# Patient Record
Sex: Female | Born: 1950 | Race: Black or African American | Hispanic: No | Smoking: Former smoker
Health system: Southern US, Community
[De-identification: ages and names within clinical notes are randomized; demographics above are authoritative.]

## PROBLEM LIST (undated history)

## (undated) DIAGNOSIS — F419 Anxiety disorder, unspecified: Secondary | ICD-10-CM

## (undated) DIAGNOSIS — J45909 Unspecified asthma, uncomplicated: Secondary | ICD-10-CM

## (undated) DIAGNOSIS — M1990 Unspecified osteoarthritis, unspecified site: Secondary | ICD-10-CM

## (undated) DIAGNOSIS — E039 Hypothyroidism, unspecified: Secondary | ICD-10-CM

## (undated) HISTORY — PX: ABDOMINAL HYSTERECTOMY: SHX81

---

## 2004-10-31 ENCOUNTER — Ambulatory Visit: Payer: Self-pay | Admitting: Gastroenterology

## 2005-04-01 ENCOUNTER — Ambulatory Visit: Payer: Self-pay | Admitting: Internal Medicine

## 2006-05-18 ENCOUNTER — Ambulatory Visit: Payer: Self-pay | Admitting: Internal Medicine

## 2007-10-10 ENCOUNTER — Ambulatory Visit: Payer: Self-pay

## 2009-04-22 ENCOUNTER — Ambulatory Visit: Payer: Self-pay

## 2010-04-24 ENCOUNTER — Ambulatory Visit: Payer: Self-pay

## 2010-05-06 ENCOUNTER — Ambulatory Visit: Payer: Self-pay | Admitting: Family Medicine

## 2011-05-13 ENCOUNTER — Ambulatory Visit: Payer: Self-pay

## 2012-07-26 ENCOUNTER — Ambulatory Visit: Payer: Self-pay

## 2013-07-27 ENCOUNTER — Ambulatory Visit: Payer: Self-pay

## 2014-08-07 ENCOUNTER — Ambulatory Visit: Payer: Self-pay

## 2015-01-10 ENCOUNTER — Ambulatory Visit
Admission: RE | Admit: 2015-01-10 | Discharge: 2015-01-10 | Disposition: A | Discharge status: Home / Self Care | Payer: BC Managed Care – PPO | Source: Ambulatory Visit | Attending: Gastroenterology | Admitting: Gastroenterology

## 2015-01-10 ENCOUNTER — Ambulatory Visit: Payer: BC Managed Care – PPO | Admitting: Anesthesiology

## 2015-01-10 ENCOUNTER — Encounter
Admission: RE | Disposition: A | Discharge status: Home / Self Care | Payer: Self-pay | Source: Ambulatory Visit | Attending: Gastroenterology

## 2015-01-10 DIAGNOSIS — Z1211 Encounter for screening for malignant neoplasm of colon: Secondary | ICD-10-CM | POA: Diagnosis present

## 2015-01-10 DIAGNOSIS — F419 Anxiety disorder, unspecified: Secondary | ICD-10-CM | POA: Insufficient documentation

## 2015-01-10 DIAGNOSIS — I10 Essential (primary) hypertension: Secondary | ICD-10-CM | POA: Diagnosis not present

## 2015-01-10 DIAGNOSIS — J45909 Unspecified asthma, uncomplicated: Secondary | ICD-10-CM | POA: Insufficient documentation

## 2015-01-10 DIAGNOSIS — R1310 Dysphagia, unspecified: Secondary | ICD-10-CM | POA: Insufficient documentation

## 2015-01-10 DIAGNOSIS — Z87891 Personal history of nicotine dependence: Secondary | ICD-10-CM | POA: Diagnosis not present

## 2015-01-10 DIAGNOSIS — E039 Hypothyroidism, unspecified: Secondary | ICD-10-CM | POA: Diagnosis not present

## 2015-01-10 DIAGNOSIS — K5730 Diverticulosis of large intestine without perforation or abscess without bleeding: Secondary | ICD-10-CM | POA: Diagnosis not present

## 2015-01-10 HISTORY — DX: Hypothyroidism, unspecified: E03.9

## 2015-01-10 HISTORY — PX: ESOPHAGOGASTRODUODENOSCOPY: SHX5428

## 2015-01-10 HISTORY — DX: Unspecified asthma, uncomplicated: J45.909

## 2015-01-10 HISTORY — DX: Unspecified osteoarthritis, unspecified site: M19.90

## 2015-01-10 HISTORY — PX: COLONOSCOPY WITH PROPOFOL: SHX5780

## 2015-01-10 HISTORY — DX: Anxiety disorder, unspecified: F41.9

## 2015-01-10 SURGERY — COLONOSCOPY WITH PROPOFOL
Anesthesia: General

## 2015-01-10 MED ORDER — SODIUM CHLORIDE 0.9 % IV SOLN
INTRAVENOUS | Status: DC
  Administered 2015-01-10: 12:00:00 via INTRAVENOUS

## 2015-01-10 MED ORDER — PROPOFOL 10 MG/ML IV BOLUS
INTRAVENOUS | Status: DC | PRN
  Administered 2015-01-10 (×2): 50 mg via INTRAVENOUS

## 2015-01-10 MED ORDER — SODIUM CHLORIDE 0.9 % IV SOLN
INTRAVENOUS | Status: DC
  Administered 2015-01-10: 1000 mL via INTRAVENOUS

## 2015-01-10 MED ORDER — MIDAZOLAM HCL 5 MG/5ML IJ SOLN
INTRAMUSCULAR | Status: DC | PRN
  Administered 2015-01-10: 1 mg via INTRAVENOUS

## 2015-01-10 MED ORDER — PROPOFOL INFUSION 10 MG/ML OPTIME
INTRAVENOUS | Status: DC | PRN
  Administered 2015-01-10: 160 ug/kg/min via INTRAVENOUS

## 2015-01-10 MED ORDER — LIDOCAINE HCL (CARDIAC) 20 MG/ML IV SOLN
INTRAVENOUS | Status: DC | PRN
  Administered 2015-01-10: 30 mg via INTRAVENOUS

## 2015-01-10 MED ORDER — FENTANYL CITRATE (PF) 100 MCG/2ML IJ SOLN
INTRAMUSCULAR | Status: DC | PRN
  Administered 2015-01-10: 50 ug via INTRAVENOUS

## 2015-01-10 NOTE — Op Note (Signed)
[facility] Gastroenterology Patient Name: Michele Barron Procedure Date: 01/10/2015 11:36 AM MRN: [id number] Account #: [id number] Date of Birth: Dec 17, 1950 Admit Type: Outpatient Age: 64 Room: [facility] ENDO ROOM 4 Gender: Female Note Status: Finalized Procedure:         Colonoscopy Indications:       Screening for colorectal malignant neoplasm Providers:         Ezzard Standing. Bluford Kaufmann, MD Referring MD:      Macario Carls (Referring MD) Medicines:         Monitored Anesthesia Care Complications:     No immediate complications. Procedure:         Pre-Anesthesia Assessment:                    - Prior to the procedure, a History and Physical was                     performed, and patient medications, allergies and                     sensitivities were reviewed. The patient's tolerance of                     previous anesthesia was reviewed.                    - The risks and benefits of the procedure and the sedation                     options and risks were discussed with the patient. All                     questions were answered and informed consent was obtained.                    - After reviewing the risks and benefits, the patient was                     deemed in satisfactory condition to undergo the procedure.                    After obtaining informed consent, the colonoscope was                     passed under direct vision. Throughout the procedure, the                     patient's blood pressure, pulse, and oxygen saturations                     were monitored continuously. The Colonoscope was                     introduced through the anus and advanced to the the cecum,                     identified by appendiceal orifice and ileocecal valve. The                     colonoscopy was performed without difficulty. The patient                     tolerated the procedure well. The quality of the bowel  preparation was good. Findings:  Multiple small and large-mouthed diverticula were found in the sigmoid       colon.      The exam was otherwise without abnormality. Impression:        - Diverticulosis in the sigmoid colon.                    - The examination was otherwise normal.                    - No specimens collected. Recommendation:    - Discharge patient to home.                    - Repeat colonoscopy in 10 years for surveillance.                    - The findings and recommendations were discussed with the                     patient. Procedure Code(s): --- Professional ---                    [phone], Colonoscopy, flexible; diagnostic, including                     collection of specimen(s) by brushing or washing, when                     performed (separate procedure) Diagnosis Code(s): --- Professional ---                    Z12.11, Encounter for screening for malignant neoplasm of                     colon                    K57.30, Diverticulosis of large intestine without                     perforation or abscess without bleeding CPT copyright 2014 American [employer] Association. All rights reserved. The codes documented in this report are preliminary and upon coder review may  be revised to meet current compliance requirements. Wallace Cullens, MD 01/10/2015 12:05:38 PM This report has been signed electronically. Number of Addenda: 0 Note Initiated On: 01/10/2015 11:36 AM Scope Withdrawal Time: 0 hours 2 minutes 49 seconds  Total Procedure Duration: 0 hours 7 minutes 15 seconds       [facility]

## 2015-01-10 NOTE — H&P (Signed)
Primary Care Physician:  PROVIDER NOT IN SYSTEM Primary Gastroenterologist:  Dr. Bluford Kaufmann  Pre-Procedure History & Physical: HPI:  Michele Barron is a 64 y.o. female is here for an EGD and colonoscopy.  No past medical history on file.  No past surgical history on file.  Prior to Admission medications   Medication Sig Start Date End Date Taking? Authorizing Provider  ALPRAZolam Prudy Feeler) 0.5 MG tablet Take 0.5 mg by mouth at bedtime as needed for sleep.   Yes Historical Provider, MD  ascorbic acid (VITAMIN C) 500 MG tablet Take 500 mg by mouth daily as needed.   Yes Historical Provider, MD  aspirin EC 81 MG tablet Take 81 mg by mouth daily.   Yes Historical Provider, MD  b complex vitamins tablet Take 1 tablet by mouth daily.   Yes Historical Provider, MD  beclomethasone (QVAR) 80 MCG/ACT inhaler Inhale 1 puff into the lungs 2 (two) times daily as needed.   Yes Historical Provider, MD  CALCIUM CARBONATE PO Take 500 mg by mouth daily.   Yes Historical Provider, MD  Cholecalciferol 1000 UNITS TBDP Take 1,000 Units by mouth daily.   Yes Historical Provider, MD  COD LIVER OIL PO Take 1 tablet by mouth daily.   Yes Historical Provider, MD  esomeprazole (NEXIUM) 20 MG capsule Take 20 mg by mouth daily.   Yes Historical Provider, MD  estrogens, conjugated, (PREMARIN) 0.625 MG tablet Take 0.625 mg by mouth daily. Take daily for 21 days then do not take for 7 days.   Yes Historical Provider, MD  fluticasone (FLONASE) 50 MCG/ACT nasal spray Place 2 sprays into both nostrils daily.   Yes Historical Provider, MD  Garlic 1000 MG CAPS Take 1 capsule by mouth daily.   Yes Historical Provider, MD  hydrochlorothiazide (MICROZIDE) 12.5 MG capsule Take 12.5 mg by mouth daily.   Yes Historical Provider, MD  Multiple Vitamin (MULTIVITAMIN) tablet Take 1 tablet by mouth daily.   Yes Historical Provider, MD  vitamin E 400 UNIT capsule Take 400 Units by mouth daily.   Yes Historical Provider, MD    Allergies as  of 12/18/2014  . (Not on File)    No family history on file.  History   Social History  . Marital Status: Widowed    Spouse Name: N/A  . Number of Children: N/A  . Years of Education: N/A   Occupational History  . Not on file.   Social History Main Topics  . Smoking status: Not on file  . Smokeless tobacco: Not on file  . Alcohol Use: Not on file  . Drug Use: Not on file  . Sexual Activity: Not on file   Other Topics Concern  . Not on file   Social History Narrative  . No narrative on file    Review of Systems: See HPI, otherwise negative ROS  Physical Exam: There were no vitals taken for this visit. General:   Alert,  pleasant and cooperative in NAD Head:  Normocephalic and atraumatic. Neck:  Supple; no masses or thyromegaly. Lungs:  Clear throughout to auscultation.    Heart:  Regular rate and rhythm. Abdomen:  Soft, nontender and nondistended. Normal bowel sounds, without guarding, and without rebound.   Neurologic:  Alert and  oriented x4;  grossly normal neurologically.  Impression/Plan: Michele Barron is here for anEGD/colonoscopy to be performed for GERD and screening colonoscopy.  Risks, benefits, limitations, and alternatives regarding  EGD/colonoscopy have been reviewed with the patient.  Questions have been answered.  All parties agreeable.   Jarman Litton, Ezzard Standing, MD  01/10/2015, 10:25 AM

## 2015-01-10 NOTE — Op Note (Signed)
[facility] Gastroenterology Patient Name: Michele Barron Procedure Date: 01/10/2015 11:42 AM MRN: [id number] Account #: [id number] Date of Birth: [birth year] Admit Type: Outpatient Age: 64 Room: [facility] ENDO ROOM 4 Gender: Female Note Status: Finalized Procedure:         Upper GI endoscopy Indications:       Dysphagia, Suspected esophageal reflux Providers:         Ezzard Standing. Bluford Kaufmann, MD Referring MD:      Macario Carls (Referring MD) Medicines:         Monitored Anesthesia Care Complications:     No immediate complications. Procedure:         Pre-Anesthesia Assessment:                    - Prior to the procedure, a History and Physical was                     performed, and patient medications, allergies and                     sensitivities were reviewed. The patient's tolerance of                     previous anesthesia was reviewed.                    - The risks and benefits of the procedure and the sedation                     options and risks were discussed with the patient. All                     questions were answered and informed consent was obtained.                    - After reviewing the risks and benefits, the patient was                     deemed in satisfactory condition to undergo the procedure.                    After obtaining informed consent, the endoscope was passed                     under direct vision. Throughout the procedure, the                     patient's blood pressure, pulse, and oxygen saturations                     were monitored continuously. The Endoscope was introduced                     through the mouth, and advanced to the second part of                     duodenum. The upper GI endoscopy was accomplished without                     difficulty. The patient tolerated the procedure well. Findings:      No endoscopic abnormality was evident in the esophagus to explain the       patient's complaint of dysphagia. It was  decided, however, to proceed       with  dilation of the entire esophagus. The scope was withdrawn. Dilation       was performed with a Maloney dilator with mild resistance at 54 Fr.      The entire examined stomach was normal.      The examined duodenum was normal. Impression:        - No endoscopic esophageal abnormality to explain                     patient's dysphagia. Esophagus dilated. Dilated.                    - Normal stomach.                    - Normal examined duodenum.                    - No specimens collected. Recommendation:    - Discharge patient to home.                    - Observe patient's clinical course.                    - Await pathology results.                    - Use Prilosec (omeprazole) 20 mg PO daily daily.                    - The findings and recommendations were discussed with the                     patient. Procedure Code(s): --- Professional ---                    [phone], Esophagogastroduodenoscopy, flexible, transoral;                     diagnostic, including collection of specimen(s) by                     brushing or washing, when performed (separate procedure)                    [id number], Dilation of esophagus, by unguided sound or bougie,                     single or multiple passes Diagnosis Code(s): --- Professional ---                    R13.10, Dysphagia, unspecified CPT copyright 2014 American Medical Association. All rights reserved. The codes documented in this report are preliminary and upon coder review may  be revised to meet current compliance requirements. Wallace Cullens, MD 01/10/2015 11:53:36 AM This report has been signed electronically. Number of Addenda: 0 Note Initiated On: 01/10/2015 11:42 AM      [facility]

## 2015-01-10 NOTE — Anesthesia Postprocedure Evaluation (Signed)
  Anesthesia Post-op Note  Patient: Michele Barron  Procedure(s) Performed: Procedure(s): COLONOSCOPY WITH PROPOFOL (N/A) ESOPHAGOGASTRODUODENOSCOPY (EGD) (N/A)  Anesthesia type:General  Patient location: PACU  Post pain: Pain level controlled  Post assessment: Post-op Vital signs reviewed, Patient's Cardiovascular Status Stable, Respiratory Function Stable, Patent Airway and No signs of Nausea or vomiting  Post vital signs: Reviewed and stable  Last Vitals:  Filed Vitals:   01/10/15 1209  BP: 102/81  Pulse: 61  Temp: 35.8 C  Resp: 16    Level of consciousness: awake, alert  and patient cooperative  Complications: No apparent anesthesia complications

## 2015-01-10 NOTE — Anesthesia Preprocedure Evaluation (Signed)
Anesthesia Evaluation  Patient identified by MRN, date of birth, ID band Patient awake    Reviewed: Allergy & Precautions, NPO status , Patient's Chart, lab work & pertinent test results  History of Anesthesia Complications Negative for: history of anesthetic complications  Airway Mallampati: II       Dental no notable dental hx.    Pulmonary asthma , former smoker,          Cardiovascular hypertension, Pt. on medications Normal cardiovascular exam    Neuro/Psych Anxiety    GI/Hepatic negative GI ROS, Neg liver ROS,   Endo/Other  Hypothyroidism   Renal/GU negative Renal ROS     Musculoskeletal   Abdominal Normal abdominal exam  (+)   Peds  Hematology negative hematology ROS (+)   Anesthesia Other Findings   Reproductive/Obstetrics                             Anesthesia Physical Anesthesia Plan  ASA: II  Anesthesia Plan: General   Post-op Pain Management:    Induction: Intravenous  Airway Management Planned: Nasal Cannula  Additional Equipment:   Intra-op Plan:   Post-operative Plan:   Informed Consent: I have reviewed the patients History and Physical, chart, labs and discussed the procedure including the risks, benefits and alternatives for the proposed anesthesia with the patient or authorized representative who has indicated his/her understanding and acceptance.     Plan Discussed with: CRNA  Anesthesia Plan Comments:         Anesthesia Quick Evaluation

## 2015-01-10 NOTE — Transfer of Care (Signed)
Immediate Anesthesia Transfer of Care Note  Patient: Michele Barron  Procedure(s) Performed: Procedure(s): COLONOSCOPY WITH PROPOFOL (N/A) ESOPHAGOGASTRODUODENOSCOPY (EGD) (N/A)  Patient Location: PACU and Short Stay  Anesthesia Type:General  Level of Consciousness: awake, oriented and patient cooperative  Airway & Oxygen Therapy: Patient Spontanous Breathing and Patient connected to nasal cannula oxygen  Post-op Assessment: Report given to RN and Post -op Vital signs reviewed and stable  Post vital signs: Reviewed and stable  Last Vitals:  Filed Vitals:   01/10/15 1209  BP:   Pulse: 61  Temp: 35.8 C  Resp: 16    Complications: No apparent anesthesia complications

## 2015-01-13 LAB — SURGICAL PATHOLOGY

## 2015-01-14 ENCOUNTER — Encounter: Payer: Self-pay | Admitting: Gastroenterology

## 2016-07-17 ENCOUNTER — Other Ambulatory Visit: Payer: Self-pay | Admitting: Family Medicine

## 2016-07-17 DIAGNOSIS — Z1239 Encounter for other screening for malignant neoplasm of breast: Secondary | ICD-10-CM

## 2016-07-17 DIAGNOSIS — Z13820 Encounter for screening for osteoporosis: Secondary | ICD-10-CM

## 2016-09-09 ENCOUNTER — Ambulatory Visit
Admission: RE | Admit: 2016-09-09 | Discharge: 2016-09-09 | Disposition: A | Discharge status: Home / Self Care | Payer: Medicare Other | Source: Ambulatory Visit | Attending: Family Medicine | Admitting: Family Medicine

## 2016-09-09 DIAGNOSIS — Z13820 Encounter for screening for osteoporosis: Secondary | ICD-10-CM | POA: Diagnosis present

## 2016-09-09 DIAGNOSIS — Z780 Asymptomatic menopausal state: Secondary | ICD-10-CM | POA: Diagnosis not present

## 2016-09-09 DIAGNOSIS — Z1239 Encounter for other screening for malignant neoplasm of breast: Secondary | ICD-10-CM | POA: Diagnosis not present

## 2016-09-09 DIAGNOSIS — Z1231 Encounter for screening mammogram for malignant neoplasm of breast: Secondary | ICD-10-CM | POA: Diagnosis not present

## 2017-09-13 ENCOUNTER — Other Ambulatory Visit: Payer: Self-pay | Admitting: Family Medicine

## 2017-09-13 DIAGNOSIS — Z1231 Encounter for screening mammogram for malignant neoplasm of breast: Secondary | ICD-10-CM

## 2017-09-22 ENCOUNTER — Ambulatory Visit
Admission: RE | Admit: 2017-09-22 | Discharge: 2017-09-22 | Disposition: A | Discharge status: Home / Self Care | Payer: Medicare Other | Source: Ambulatory Visit | Attending: Family Medicine | Admitting: Family Medicine

## 2017-09-22 ENCOUNTER — Encounter (INDEPENDENT_AMBULATORY_CARE_PROVIDER_SITE_OTHER): Payer: Self-pay

## 2017-09-22 DIAGNOSIS — Z1231 Encounter for screening mammogram for malignant neoplasm of breast: Secondary | ICD-10-CM

## 2018-11-28 ENCOUNTER — Other Ambulatory Visit: Payer: Self-pay | Admitting: Family Medicine

## 2018-11-28 DIAGNOSIS — Z1231 Encounter for screening mammogram for malignant neoplasm of breast: Secondary | ICD-10-CM

## 2018-12-02 ENCOUNTER — Other Ambulatory Visit: Payer: Self-pay

## 2018-12-02 ENCOUNTER — Ambulatory Visit
Admission: RE | Admit: 2018-12-02 | Discharge: 2018-12-02 | Disposition: A | Discharge status: Home / Self Care | Payer: Medicare Other | Source: Ambulatory Visit | Attending: Family Medicine | Admitting: Family Medicine

## 2018-12-02 ENCOUNTER — Encounter (INDEPENDENT_AMBULATORY_CARE_PROVIDER_SITE_OTHER): Payer: Self-pay

## 2018-12-02 DIAGNOSIS — Z1231 Encounter for screening mammogram for malignant neoplasm of breast: Secondary | ICD-10-CM | POA: Insufficient documentation

## 2018-12-13 ENCOUNTER — Other Ambulatory Visit: Payer: Self-pay | Admitting: Family Medicine

## 2018-12-13 DIAGNOSIS — N6489 Other specified disorders of breast: Secondary | ICD-10-CM

## 2018-12-13 DIAGNOSIS — N6320 Unspecified lump in the left breast, unspecified quadrant: Secondary | ICD-10-CM

## 2018-12-13 DIAGNOSIS — R928 Other abnormal and inconclusive findings on diagnostic imaging of breast: Secondary | ICD-10-CM

## 2018-12-15 ENCOUNTER — Ambulatory Visit
Admission: RE | Admit: 2018-12-15 | Discharge: 2018-12-15 | Disposition: A | Discharge status: Home / Self Care | Payer: Medicare Other | Source: Ambulatory Visit | Attending: Family Medicine | Admitting: Family Medicine

## 2018-12-15 ENCOUNTER — Other Ambulatory Visit: Payer: Self-pay

## 2018-12-15 DIAGNOSIS — N6320 Unspecified lump in the left breast, unspecified quadrant: Secondary | ICD-10-CM | POA: Diagnosis present

## 2018-12-15 DIAGNOSIS — R928 Other abnormal and inconclusive findings on diagnostic imaging of breast: Secondary | ICD-10-CM

## 2018-12-15 DIAGNOSIS — N6489 Other specified disorders of breast: Secondary | ICD-10-CM

## 2018-12-19 ENCOUNTER — Other Ambulatory Visit: Payer: Self-pay | Admitting: Family Medicine

## 2018-12-19 DIAGNOSIS — R928 Other abnormal and inconclusive findings on diagnostic imaging of breast: Secondary | ICD-10-CM

## 2018-12-19 DIAGNOSIS — R921 Mammographic calcification found on diagnostic imaging of breast: Secondary | ICD-10-CM

## 2018-12-26 ENCOUNTER — Other Ambulatory Visit: Payer: Self-pay

## 2018-12-26 ENCOUNTER — Ambulatory Visit
Admission: RE | Admit: 2018-12-26 | Discharge: 2018-12-26 | Disposition: A | Discharge status: Home / Self Care | Payer: Medicare Other | Source: Ambulatory Visit | Attending: Family Medicine | Admitting: Family Medicine

## 2018-12-26 DIAGNOSIS — R928 Other abnormal and inconclusive findings on diagnostic imaging of breast: Secondary | ICD-10-CM | POA: Diagnosis not present

## 2018-12-26 DIAGNOSIS — R921 Mammographic calcification found on diagnostic imaging of breast: Secondary | ICD-10-CM

## 2018-12-26 HISTORY — PX: BREAST BIOPSY: SHX20

## 2018-12-27 LAB — SURGICAL PATHOLOGY

## 2019-06-07 ENCOUNTER — Other Ambulatory Visit: Payer: Self-pay | Admitting: Family Medicine

## 2019-06-07 DIAGNOSIS — R928 Other abnormal and inconclusive findings on diagnostic imaging of breast: Secondary | ICD-10-CM

## 2019-06-29 ENCOUNTER — Ambulatory Visit
Admission: RE | Admit: 2019-06-29 | Discharge: 2019-06-29 | Disposition: A | Discharge status: Home / Self Care | Payer: Medicare PPO | Source: Ambulatory Visit | Attending: Family Medicine | Admitting: Family Medicine

## 2019-06-29 DIAGNOSIS — R928 Other abnormal and inconclusive findings on diagnostic imaging of breast: Secondary | ICD-10-CM | POA: Diagnosis present

## 2020-01-24 ENCOUNTER — Other Ambulatory Visit: Payer: Self-pay | Admitting: Family Medicine

## 2020-01-24 DIAGNOSIS — N6489 Other specified disorders of breast: Secondary | ICD-10-CM

## 2020-02-02 ENCOUNTER — Other Ambulatory Visit: Payer: Self-pay

## 2020-02-02 ENCOUNTER — Ambulatory Visit
Admission: RE | Admit: 2020-02-02 | Discharge: 2020-02-02 | Disposition: A | Discharge status: Home / Self Care | Payer: Medicare PPO | Source: Ambulatory Visit | Attending: Family Medicine | Admitting: Family Medicine

## 2020-02-02 DIAGNOSIS — N6489 Other specified disorders of breast: Secondary | ICD-10-CM | POA: Insufficient documentation

## 2020-11-04 ENCOUNTER — Other Ambulatory Visit: Payer: Self-pay | Admitting: Family Medicine

## 2020-11-04 DIAGNOSIS — Z1231 Encounter for screening mammogram for malignant neoplasm of breast: Secondary | ICD-10-CM

## 2021-02-03 ENCOUNTER — Ambulatory Visit
Admission: RE | Admit: 2021-02-03 | Discharge: 2021-02-03 | Disposition: A | Discharge status: Home / Self Care | Payer: Medicare PPO | Source: Ambulatory Visit | Attending: Family Medicine | Admitting: Family Medicine

## 2021-02-03 ENCOUNTER — Other Ambulatory Visit: Payer: Self-pay

## 2021-02-03 DIAGNOSIS — Z1231 Encounter for screening mammogram for malignant neoplasm of breast: Secondary | ICD-10-CM | POA: Diagnosis present

## 2021-12-03 ENCOUNTER — Other Ambulatory Visit: Payer: Self-pay | Admitting: Family Medicine

## 2021-12-03 DIAGNOSIS — Z1231 Encounter for screening mammogram for malignant neoplasm of breast: Secondary | ICD-10-CM

## 2022-02-10 ENCOUNTER — Ambulatory Visit: Payer: Medicare PPO

## 2022-02-10 ENCOUNTER — Ambulatory Visit
Admission: RE | Admit: 2022-02-10 | Discharge: 2022-02-10 | Disposition: A | Discharge status: Home / Self Care | Payer: Medicare PPO | Source: Ambulatory Visit | Attending: Family Medicine | Admitting: Family Medicine

## 2022-02-10 DIAGNOSIS — Z1231 Encounter for screening mammogram for malignant neoplasm of breast: Secondary | ICD-10-CM | POA: Insufficient documentation

## 2023-01-11 ENCOUNTER — Other Ambulatory Visit: Payer: Self-pay | Admitting: Family Medicine

## 2023-01-11 DIAGNOSIS — Z1231 Encounter for screening mammogram for malignant neoplasm of breast: Secondary | ICD-10-CM

## 2023-02-12 ENCOUNTER — Ambulatory Visit
Admission: RE | Admit: 2023-02-12 | Discharge: 2023-02-12 | Disposition: A | Discharge status: Home / Self Care | Payer: Medicare PPO | Source: Ambulatory Visit | Attending: Family Medicine | Admitting: Family Medicine

## 2023-02-12 DIAGNOSIS — Z1231 Encounter for screening mammogram for malignant neoplasm of breast: Secondary | ICD-10-CM | POA: Diagnosis present

## 2023-02-15 ENCOUNTER — Encounter: Payer: Self-pay | Admitting: Family Medicine

## 2023-02-18 ENCOUNTER — Encounter: Payer: Self-pay | Admitting: Family Medicine

## 2023-03-05 ENCOUNTER — Other Ambulatory Visit: Payer: Self-pay | Admitting: Family Medicine

## 2023-03-05 DIAGNOSIS — N63 Unspecified lump in unspecified breast: Secondary | ICD-10-CM

## 2023-03-05 DIAGNOSIS — R928 Other abnormal and inconclusive findings on diagnostic imaging of breast: Secondary | ICD-10-CM

## 2023-03-12 ENCOUNTER — Ambulatory Visit
Admission: RE | Admit: 2023-03-12 | Discharge: 2023-03-12 | Disposition: A | Discharge status: Home / Self Care | Payer: Medicare PPO | Source: Ambulatory Visit | Attending: Family Medicine | Admitting: Family Medicine

## 2023-03-12 DIAGNOSIS — N63 Unspecified lump in unspecified breast: Secondary | ICD-10-CM | POA: Diagnosis present

## 2023-03-12 DIAGNOSIS — R928 Other abnormal and inconclusive findings on diagnostic imaging of breast: Secondary | ICD-10-CM | POA: Insufficient documentation

## 2023-05-10 IMAGING — MG MM DIGITAL SCREENING BILAT W/ TOMO AND CAD
8 series · 8 of 24 positions shown · non-contrast
Comparison: Previous exam(s).

CLINICAL DATA: Screening.

EXAM:
DIGITAL SCREENING BILATERAL MAMMOGRAM WITH TOMOSYNTHESIS AND CAD
TECHNIQUE: Bilateral screening digital craniocaudal and mediolateral oblique
mammograms were obtained. Bilateral screening digital breast
tomosynthesis was performed. The images were evaluated with
computer-aided detection.

[L CC synth-2D]
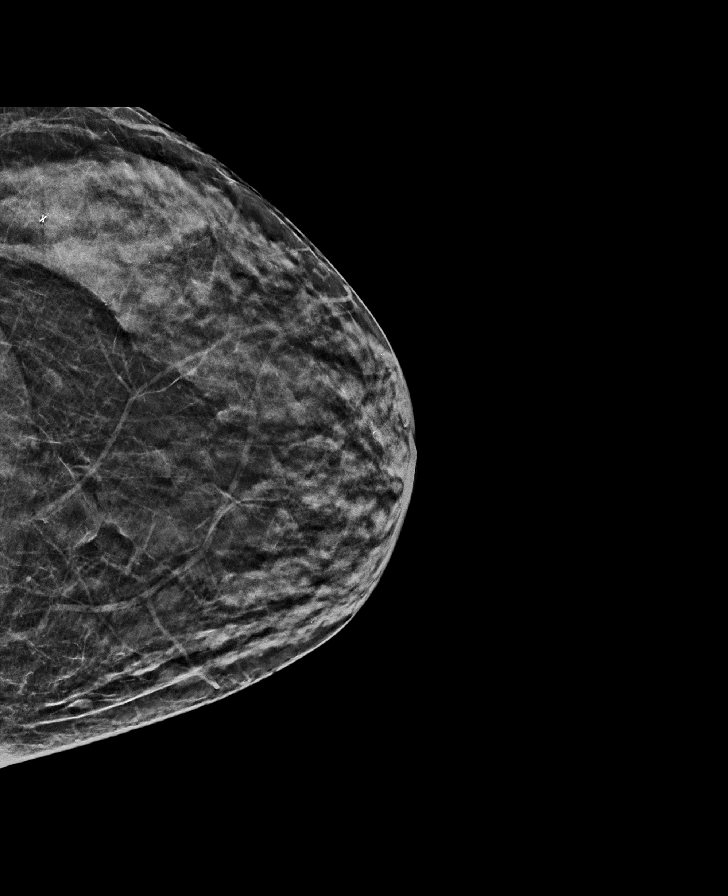

[R CC synth-2D]
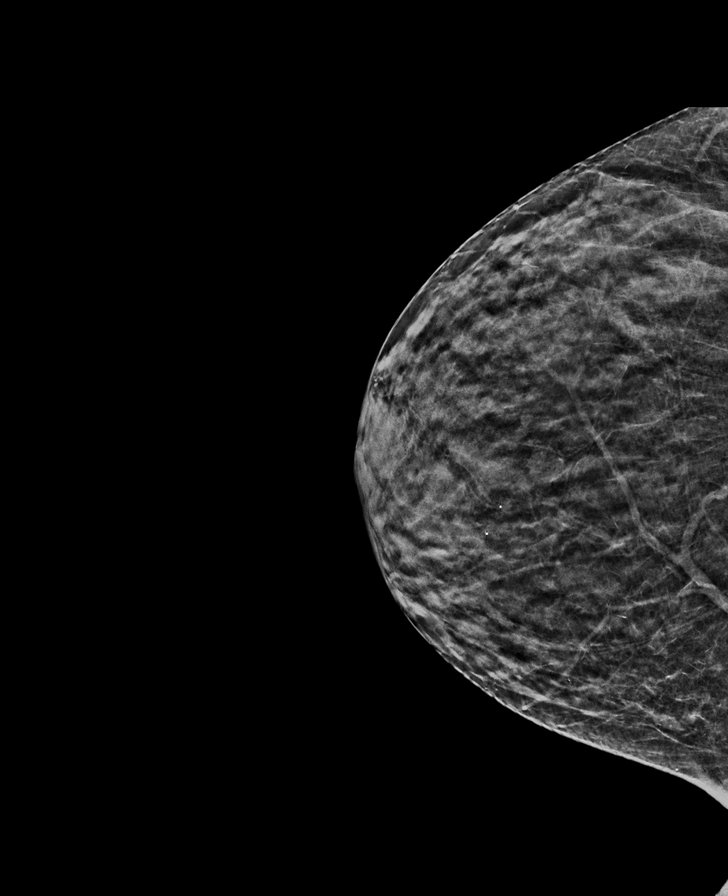

[R MLO synth-2D]
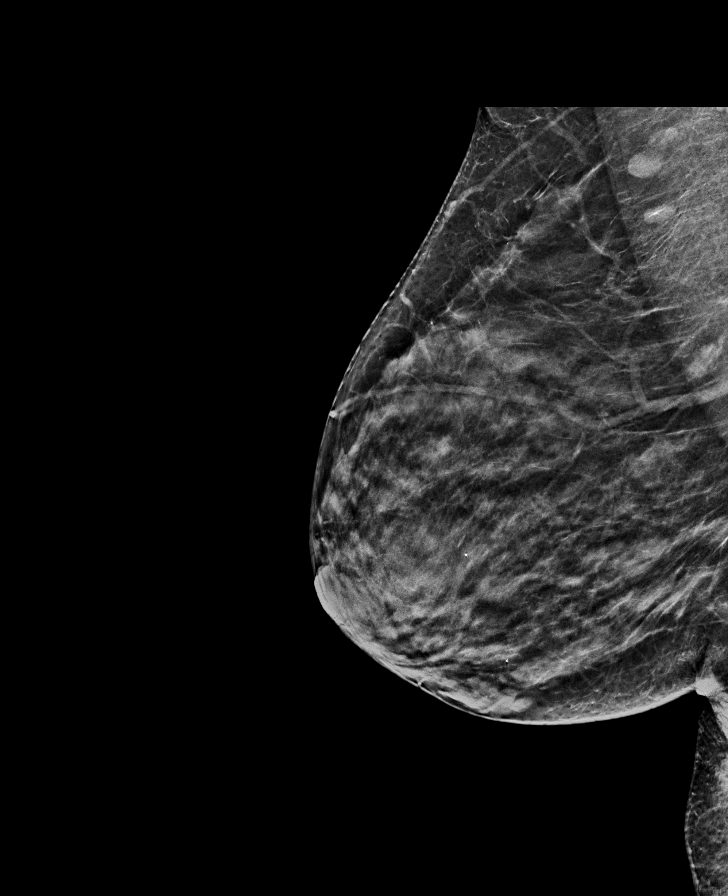

[L MLO synth-2D]
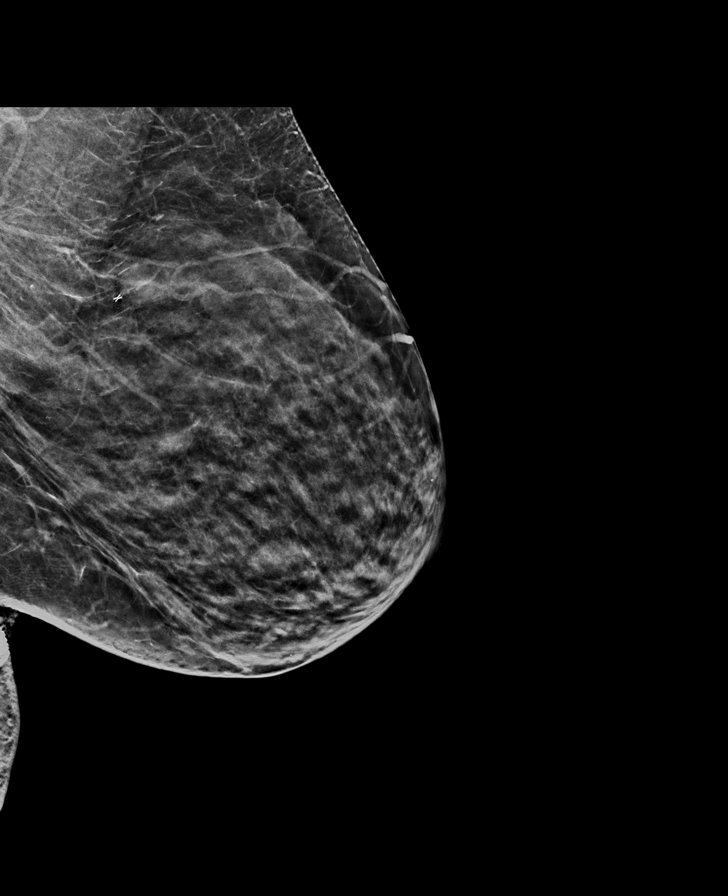

[R MLO tomo · tomo slice 28/55.0]
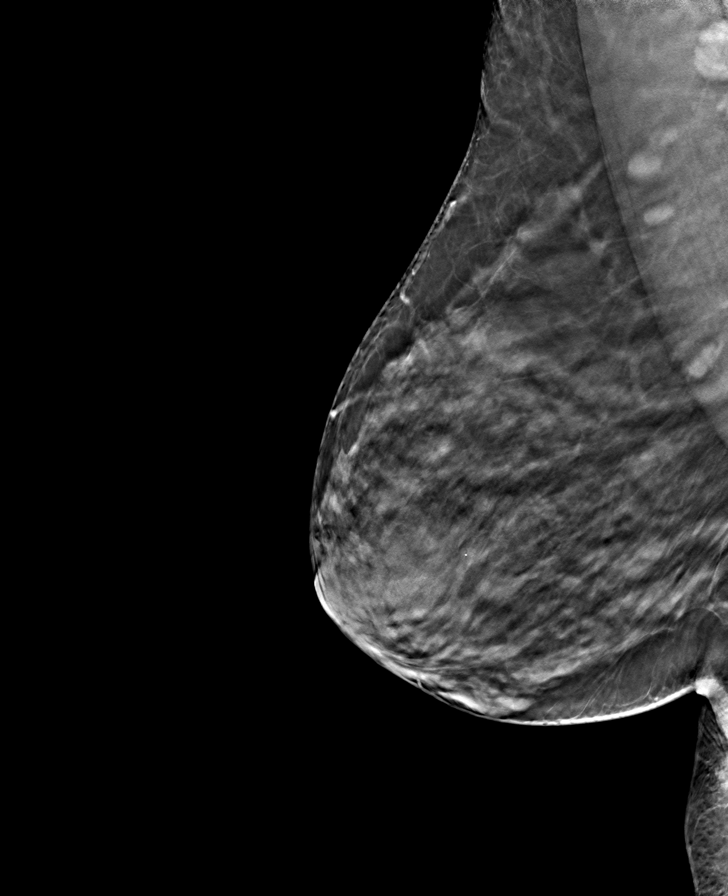

[L CC tomo · tomo slice 25/49.0]
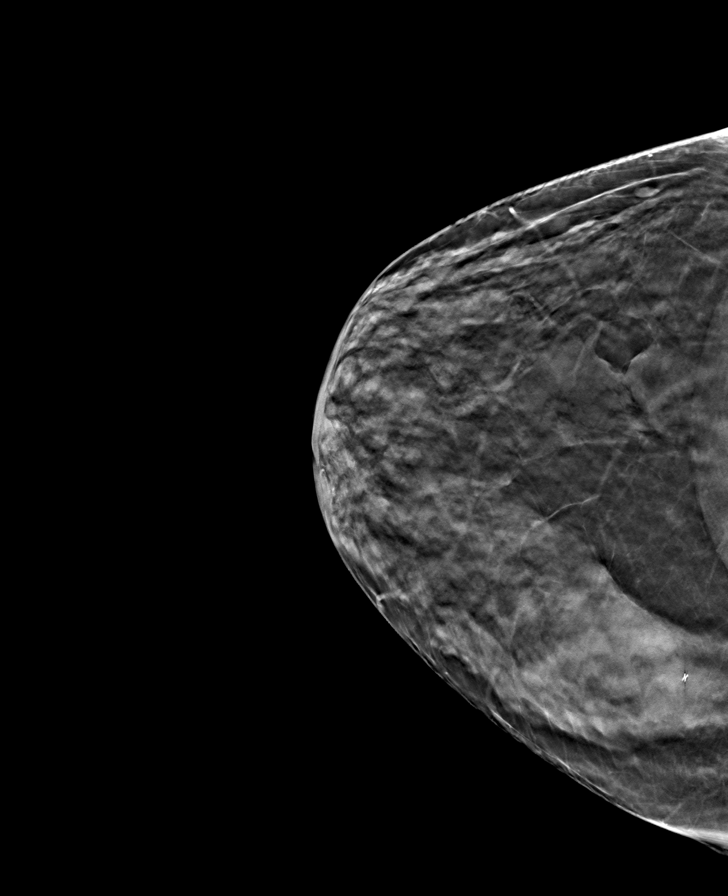

[L MLO tomo · tomo slice 27/53.0]
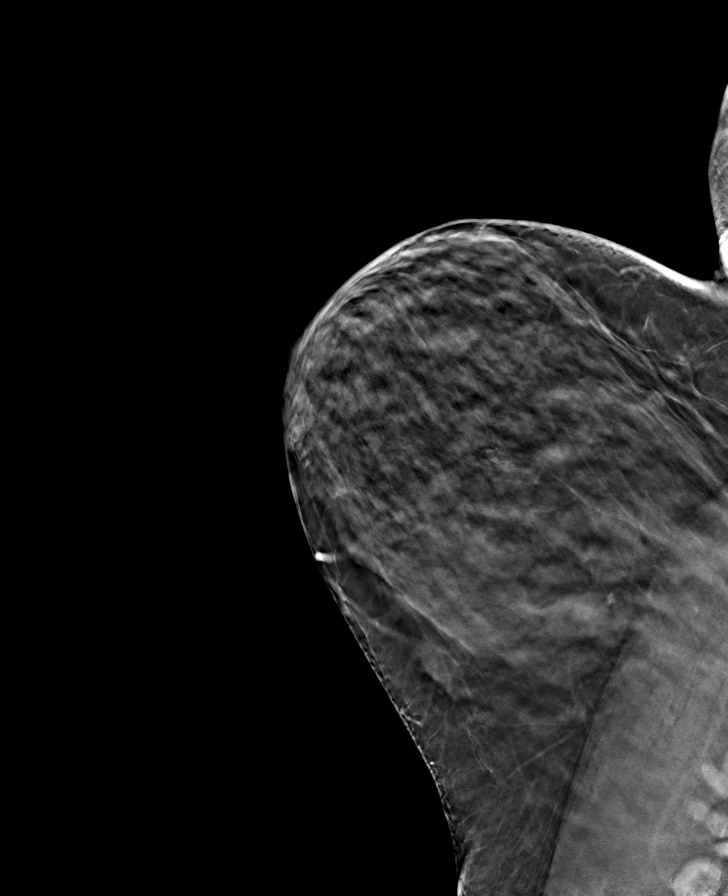

[R CC tomo · tomo slice 23/46.0]
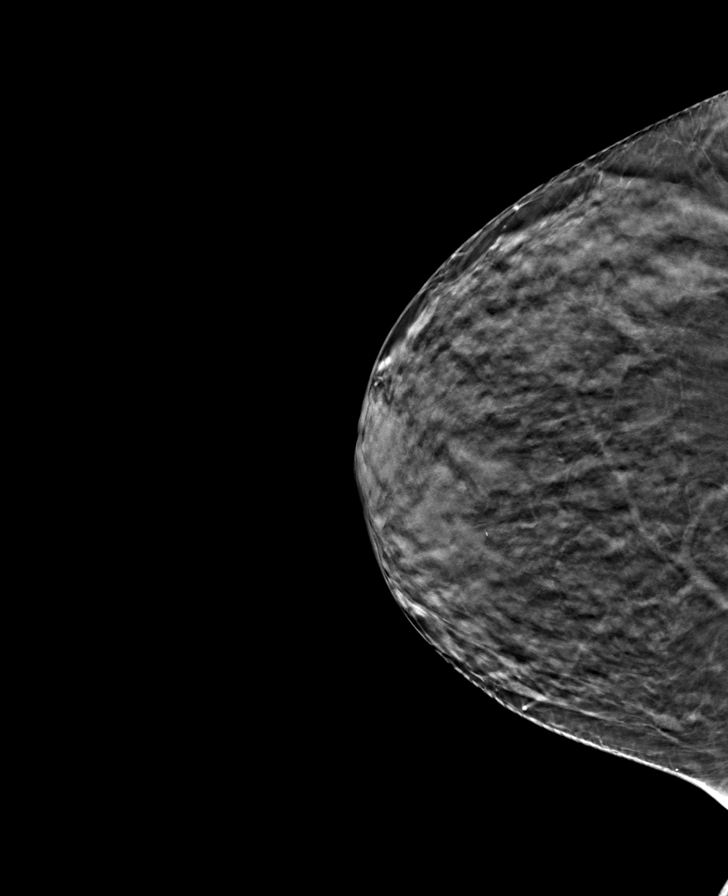

[8 of 24 positions shown; findings below may reference images not displayed]

ACR Breast Density Category c: The breast tissue is heterogeneously
dense, which may obscure small masses.
FINDINGS: There are no findings suspicious for malignancy.
IMPRESSION: No mammographic evidence of malignancy. A result letter of this
screening mammogram will be mailed directly to the patient.

RECOMMENDATION:
Screening mammogram in one year. (Code:[id number])

BI-RADS CATEGORY  1: Negative.

## 2024-02-16 ENCOUNTER — Other Ambulatory Visit: Payer: Self-pay | Admitting: Family Medicine

## 2024-02-16 DIAGNOSIS — Z1231 Encounter for screening mammogram for malignant neoplasm of breast: Secondary | ICD-10-CM

## 2024-03-16 ENCOUNTER — Ambulatory Visit
Admission: RE | Admit: 2024-03-16 | Discharge: 2024-03-16 | Disposition: A | Discharge status: Home / Self Care | Source: Ambulatory Visit | Attending: Family Medicine | Admitting: Family Medicine

## 2024-03-16 DIAGNOSIS — Z1231 Encounter for screening mammogram for malignant neoplasm of breast: Secondary | ICD-10-CM | POA: Insufficient documentation

## 2024-03-20 ENCOUNTER — Encounter: Payer: Self-pay | Admitting: Family Medicine

## 2024-04-10 ENCOUNTER — Other Ambulatory Visit: Payer: Self-pay | Admitting: Family Medicine

## 2024-04-10 DIAGNOSIS — R928 Other abnormal and inconclusive findings on diagnostic imaging of breast: Secondary | ICD-10-CM

## 2024-04-12 ENCOUNTER — Ambulatory Visit
Admission: RE | Admit: 2024-04-12 | Discharge: 2024-04-12 | Disposition: A | Discharge status: Home / Self Care | Source: Ambulatory Visit | Attending: Family Medicine | Admitting: Family Medicine

## 2024-04-12 ENCOUNTER — Inpatient Hospital Stay: Admission: RE | Admit: 2024-04-12 | Discharge: 2024-04-12 | Attending: Family Medicine | Admitting: Family Medicine

## 2024-04-12 ENCOUNTER — Other Ambulatory Visit: Payer: Self-pay | Admitting: Family Medicine

## 2024-04-12 DIAGNOSIS — R928 Other abnormal and inconclusive findings on diagnostic imaging of breast: Secondary | ICD-10-CM

## 2024-04-13 ENCOUNTER — Ambulatory Visit
Admission: RE | Admit: 2024-04-13 | Discharge: 2024-04-13 | Disposition: A | Discharge status: Home / Self Care | Source: Ambulatory Visit | Attending: Family Medicine | Admitting: Family Medicine

## 2024-04-13 ENCOUNTER — Inpatient Hospital Stay: Admission: RE | Admit: 2024-04-13 | Discharge: 2024-04-13 | Attending: Family Medicine | Admitting: Family Medicine

## 2024-04-13 DIAGNOSIS — C50211 Malignant neoplasm of upper-inner quadrant of right female breast: Secondary | ICD-10-CM | POA: Diagnosis present

## 2024-04-13 DIAGNOSIS — R928 Other abnormal and inconclusive findings on diagnostic imaging of breast: Secondary | ICD-10-CM | POA: Insufficient documentation

## 2024-04-13 HISTORY — PX: BREAST BIOPSY: SHX20

## 2024-04-13 MED ORDER — LIDOCAINE-EPINEPHRINE 1 %-1:100000 IJ SOLN
8.0000 mL | Freq: Once | INTRAMUSCULAR | Status: AC
  Administered 2024-04-13: 8 mL via INTRADERMAL
  Filled 2024-04-13: qty 8

## 2024-04-13 MED ORDER — LIDOCAINE 1 % OPTIME INJ - NO CHARGE
2.0000 mL | Freq: Once | INTRAMUSCULAR | Status: AC
  Administered 2024-04-13: 2 mL via INTRADERMAL
  Filled 2024-04-13: qty 2

## 2024-04-14 LAB — SURGICAL PATHOLOGY

## 2024-04-17 ENCOUNTER — Encounter: Payer: Self-pay | Admitting: *Deleted

## 2024-04-17 NOTE — Progress Notes (Signed)
 Ms. Michele Barron has not returned call but upon chart review, she has already been referred to [facility] and has an appt. On 10/30 to see [facility] breast surgeon.  Navigation will sign off.

## 2024-04-17 NOTE — Progress Notes (Signed)
 Received referral for newly diagnosed breast cancer from [facility] Radiology.  Navigation initiated.  Left VM asking for return call to discuss surgeons and medical oncologists.
